# Patient Record
Sex: Male | Born: 1965 | Hispanic: Yes | Marital: Married | Smoking: Former smoker
Health system: Southern US, Community
[De-identification: ages and names within clinical notes are randomized; demographics above are authoritative.]

## PROBLEM LIST (undated history)

## (undated) DIAGNOSIS — Q760 Spina bifida occulta: Secondary | ICD-10-CM

## (undated) DIAGNOSIS — I10 Essential (primary) hypertension: Secondary | ICD-10-CM

## (undated) DIAGNOSIS — G4730 Sleep apnea, unspecified: Secondary | ICD-10-CM

## (undated) DIAGNOSIS — F329 Major depressive disorder, single episode, unspecified: Secondary | ICD-10-CM

## (undated) DIAGNOSIS — F32A Depression, unspecified: Secondary | ICD-10-CM

## (undated) HISTORY — PX: HEMORRHOID SURGERY: SHX153

## (undated) HISTORY — PX: OTHER SURGICAL HISTORY: SHX169

## (undated) HISTORY — DX: Depression, unspecified: F32.A

## (undated) HISTORY — DX: Major depressive disorder, single episode, unspecified: F32.9

## (undated) HISTORY — DX: Essential (primary) hypertension: I10

## (undated) HISTORY — DX: Sleep apnea, unspecified: G47.30

## (undated) HISTORY — PX: NO PAST SURGERIES: SHX2092

---

## 2010-10-19 ENCOUNTER — Ambulatory Visit: Payer: Self-pay | Admitting: Internal Medicine

## 2013-10-21 DIAGNOSIS — E876 Hypokalemia: Secondary | ICD-10-CM | POA: Insufficient documentation

## 2013-10-21 DIAGNOSIS — K648 Other hemorrhoids: Secondary | ICD-10-CM | POA: Insufficient documentation

## 2013-10-21 DIAGNOSIS — N401 Enlarged prostate with lower urinary tract symptoms: Secondary | ICD-10-CM

## 2013-10-21 DIAGNOSIS — N138 Other obstructive and reflux uropathy: Secondary | ICD-10-CM | POA: Insufficient documentation

## 2013-10-21 DIAGNOSIS — E785 Hyperlipidemia, unspecified: Secondary | ICD-10-CM | POA: Insufficient documentation

## 2013-12-01 ENCOUNTER — Ambulatory Visit: Payer: Self-pay | Admitting: Internal Medicine

## 2014-01-07 ENCOUNTER — Emergency Department: Payer: Self-pay | Admitting: Internal Medicine

## 2014-01-07 LAB — URINALYSIS, COMPLETE
BILIRUBIN, UR: NEGATIVE
BLOOD: NEGATIVE
Bacteria: NONE SEEN
Glucose,UR: NEGATIVE mg/dL (ref 0–75)
Ketone: NEGATIVE
Leukocyte Esterase: NEGATIVE
Nitrite: NEGATIVE
Ph: 6 (ref 4.5–8.0)
Protein: 30
RBC,UR: 1 /HPF (ref 0–5)
SPECIFIC GRAVITY: 1.02 (ref 1.003–1.030)
SQUAMOUS EPITHELIAL: NONE SEEN

## 2014-01-07 LAB — CBC
HCT: 46.3 % (ref 40.0–52.0)
HGB: 15.5 g/dL (ref 13.0–18.0)
MCH: 30.6 pg (ref 26.0–34.0)
MCHC: 33.4 g/dL (ref 32.0–36.0)
MCV: 92 fL (ref 80–100)
Platelet: 264 10*3/uL (ref 150–440)
RBC: 5.05 10*6/uL (ref 4.40–5.90)
RDW: 12.9 % (ref 11.5–14.5)
WBC: 6.7 10*3/uL (ref 3.8–10.6)

## 2014-01-07 LAB — COMPREHENSIVE METABOLIC PANEL
ALBUMIN: 4.1 g/dL (ref 3.4–5.0)
AST: 37 U/L (ref 15–37)
Alkaline Phosphatase: 86 U/L
Anion Gap: 7 (ref 7–16)
BUN: 12 mg/dL (ref 7–18)
Bilirubin,Total: 0.7 mg/dL (ref 0.2–1.0)
CHLORIDE: 103 mmol/L (ref 98–107)
CO2: 30 mmol/L (ref 21–32)
CREATININE: 0.79 mg/dL (ref 0.60–1.30)
Calcium, Total: 8.8 mg/dL (ref 8.5–10.1)
EGFR (African American): >60
Glucose: 100 mg/dL — ABNORMAL HIGH (ref 65–99)
OSMOLALITY: 279 (ref 275–301)
Potassium: 3.3 mmol/L — ABNORMAL LOW (ref 3.5–5.1)
SGPT (ALT): 46 U/L
Sodium: 140 mmol/L (ref 136–145)
TOTAL PROTEIN: 8.5 g/dL — AB (ref 6.4–8.2)

## 2014-01-07 LAB — TROPONIN I

## 2014-02-03 DIAGNOSIS — D3500 Benign neoplasm of unspecified adrenal gland: Secondary | ICD-10-CM | POA: Insufficient documentation

## 2014-02-03 DIAGNOSIS — I152 Hypertension secondary to endocrine disorders: Secondary | ICD-10-CM | POA: Insufficient documentation

## 2014-12-24 DIAGNOSIS — N401 Enlarged prostate with lower urinary tract symptoms: Secondary | ICD-10-CM | POA: Insufficient documentation

## 2014-12-24 DIAGNOSIS — I10 Essential (primary) hypertension: Secondary | ICD-10-CM | POA: Insufficient documentation

## 2014-12-24 DIAGNOSIS — E7800 Pure hypercholesterolemia, unspecified: Secondary | ICD-10-CM | POA: Insufficient documentation

## 2014-12-24 DIAGNOSIS — R351 Nocturia: Secondary | ICD-10-CM

## 2014-12-24 DIAGNOSIS — G629 Polyneuropathy, unspecified: Secondary | ICD-10-CM | POA: Insufficient documentation

## 2015-01-25 DIAGNOSIS — R252 Cramp and spasm: Secondary | ICD-10-CM | POA: Insufficient documentation

## 2015-01-30 ENCOUNTER — Other Ambulatory Visit: Payer: Self-pay | Admitting: Internal Medicine

## 2015-02-01 ENCOUNTER — Other Ambulatory Visit: Payer: Self-pay | Admitting: Internal Medicine

## 2015-02-01 DIAGNOSIS — D3501 Benign neoplasm of right adrenal gland: Secondary | ICD-10-CM

## 2015-02-09 ENCOUNTER — Ambulatory Visit: Admission: RE | Admit: 2015-02-09 | Payer: Self-pay | Source: Ambulatory Visit

## 2015-05-31 DIAGNOSIS — R351 Nocturia: Secondary | ICD-10-CM

## 2015-05-31 DIAGNOSIS — R748 Abnormal levels of other serum enzymes: Secondary | ICD-10-CM | POA: Insufficient documentation

## 2015-05-31 DIAGNOSIS — N401 Enlarged prostate with lower urinary tract symptoms: Secondary | ICD-10-CM | POA: Insufficient documentation

## 2015-11-01 ENCOUNTER — Other Ambulatory Visit: Payer: Self-pay | Admitting: Internal Medicine

## 2015-11-01 DIAGNOSIS — D3501 Benign neoplasm of right adrenal gland: Secondary | ICD-10-CM

## 2015-11-15 ENCOUNTER — Encounter: Payer: Self-pay | Admitting: Urology

## 2015-11-15 ENCOUNTER — Ambulatory Visit (INDEPENDENT_AMBULATORY_CARE_PROVIDER_SITE_OTHER): Payer: PRIVATE HEALTH INSURANCE | Admitting: Urology

## 2015-11-15 VITALS — BP 141/79 | HR 89 | Ht 65.0 in | Wt 204.6 lb

## 2015-11-15 DIAGNOSIS — R350 Frequency of micturition: Secondary | ICD-10-CM

## 2015-11-15 DIAGNOSIS — R300 Dysuria: Secondary | ICD-10-CM | POA: Diagnosis not present

## 2015-11-15 DIAGNOSIS — R351 Nocturia: Secondary | ICD-10-CM | POA: Diagnosis not present

## 2015-11-15 DIAGNOSIS — R39198 Other difficulties with micturition: Secondary | ICD-10-CM | POA: Diagnosis not present

## 2015-11-15 LAB — URINALYSIS, COMPLETE
Bilirubin, UA: NEGATIVE
GLUCOSE, UA: NEGATIVE
KETONES UA: NEGATIVE
Leukocytes, UA: NEGATIVE
Nitrite, UA: NEGATIVE
PROTEIN UA: NEGATIVE
RBC, UA: NEGATIVE
SPEC GRAV UA: 1.02 (ref 1.005–1.030)
Urobilinogen, Ur: 0.2 mg/dL (ref 0.2–1.0)
pH, UA: 6.5 (ref 5.0–7.5)

## 2015-11-15 LAB — MICROSCOPIC EXAMINATION
Bacteria, UA: NONE SEEN
Epithelial Cells (non renal): NONE SEEN /hpf (ref 0–10)

## 2015-11-15 LAB — BLADDER SCAN AMB NON-IMAGING: Scan Result: 24

## 2015-11-15 NOTE — Progress Notes (Signed)
11/15/2015 4:17 PM   Derek Koch Dec 23, 1965 [id number]  Referring provider: Dr. Candiss Norse  Chief Complaint  Patient presents with  . Prostatitis    New Patient    HPI: Patient is a 50 year old Hispanic male who presents today with interpreter, Derek Koch, who is a referral from Dr. Candiss Norse for prostatitis.    Patient complains of frequency of urination, urgency, dysuria, nocturia, incontinence, intermittency, hesitancy, straining to urinate and a weak urinary stream.  He states he has been experiencing these symptoms for over 10 years.  He states he feels a burning inside his penis and up into the left groin.  He experiences nocturia 5-6 times nightly.  He has been on tamsulosin 0.4 mg daily since July with no change in his urinating symptoms.  He was given a prescription for Levaquin on 10/30/2015 for prostatitis, but he states he has not noted any change in his urination.  He has not had any gross hematuria.  His UA today was unremarkable. His PVR was 24 mL.  His I PSS score is 30/6.       IPSS    Row Name 11/15/15 1600         International Prostate Symptom Score   How often have you had the sensation of not emptying your bladder? Almost always     How often have you had to urinate less than every two hours? Almost always     How often have you found you stopped and started again several times when you urinated? Less than 1 in 5 times     How often have you found it difficult to postpone urination? Almost always     How often have you had a weak urinary stream? More than half the time     How often have you had to strain to start urination? Almost always     How many times did you typically get up at night to urinate? 5 Times     Total IPSS Score 30       Quality of Life due to urinary symptoms   If you were to spend the rest of your life with your urinary condition just the way it is now how would you feel about that? Terrible        Score:  1-7 Mild 8-19  Moderate 20-35 Severe  He states when he urinates the urinary stream twists to the left side of the penis. This has been going on for the last 2 years.   PMH: Past Medical History:  Diagnosis Date  . Depression   . Hypertension   . Sleep apnea     Surgical History: Past Surgical History:  Procedure Laterality Date  . NO PAST SURGERIES      Home Medications:    Medication List       Accurate as of 11/15/15  4:17 PM. Always use your most recent med list.          hydrALAZINE 25 MG tablet Commonly known as:  APRESOLINE Take 25 mg by mouth 3 (three) times daily.   levofloxacin 500 MG tablet Commonly known as:  LEVAQUIN Take by mouth.   spironolactone 50 MG tablet Commonly known as:  ALDACTONE Take 50 mg by mouth daily.   tamsulosin 0.4 MG Caps capsule Commonly known as:  FLOMAX Take by mouth.       Allergies: No Known Allergies  Family History: Family History  Problem Relation Age of Onset  . Prostate cancer  Paternal Uncle   . Bladder Cancer Neg Hx   . Kidney cancer Neg Hx     Social History:  reports that he has quit smoking. He has never used smokeless tobacco. He reports that he drinks alcohol. He reports that he does not use drugs.  ROS: UROLOGY Frequent Urination?: Yes Hard to postpone urination?: Yes Burning/pain with urination?: Yes Get up at night to urinate?: Yes Leakage of urine?: Yes Urine stream starts and stops?: Yes Trouble starting stream?: Yes Do you have to strain to urinate?: Yes Blood in urine?: No Urinary tract infection?: No Sexually transmitted disease?: No Injury to kidneys or bladder?: No Painful intercourse?: No Weak stream?: Yes Erection problems?: Yes Penile pain?: No  Gastrointestinal Nausea?: No Vomiting?: No Indigestion/heartburn?: No Diarrhea?: No Constipation?: Yes  Constitutional Fever: Yes Night sweats?: Yes Weight loss?: No Fatigue?: No  Skin Skin rash/lesions?: Yes Itching?:  Yes  Eyes Blurred vision?: Yes Double vision?: No  Ears/Nose/Throat Sore throat?: No Sinus problems?: No  Hematologic/Lymphatic Swollen glands?: Yes Easy bruising?: Yes  Cardiovascular Leg swelling?: Yes Chest pain?: No  Respiratory Cough?: No Shortness of breath?: No  Endocrine Excessive thirst?: Yes  Musculoskeletal Back pain?: No Joint pain?: Yes  Neurological Headaches?: No Dizziness?: No  Psychologic Depression?: Yes Anxiety?: No  Physical Exam: BP (!) 141/79 (BP Location: Left Arm, Patient Position: Sitting, Cuff Size: Large)   Pulse 89   Ht 5\' 5"  (1.651 m)   Wt 204 lb 9.6 oz (92.8 kg)   BMI 34.05 kg/m   Constitutional: Well nourished. Alert and oriented, No acute distress. HEENT: [place] AT, moist mucus membranes. Trachea midline, no masses. Cardiovascular: No clubbing, cyanosis, or edema. Respiratory: Normal respiratory effort, no increased work of breathing. GI: Abdomen is soft, non tender, non distended, no abdominal masses. Liver and spleen not palpable.  No hernias appreciated.  Stool sample for occult testing is not indicated.   GU: No CVA tenderness.  No bladder fullness or masses.  Patient with uncircumcised phallus. Foreskin easily retracted  Urethral meatus is patent.  No penile discharge. No penile lesions or rashes. Scrotum without lesions, cysts, rashes and/or edema.  Testicles are located scrotally bilaterally. No masses are appreciated in the testicles. Left and right epididymis are normal. Rectal: Patient with  normal sphincter tone. Anus and perineum without scarring or rashes. No rectal masses are appreciated. Prostate is approximately 55 grams, no nodules are appreciated. Seminal vesicles are normal. Skin: No rashes, bruises or suspicious lesions. Lymph: No cervical or inguinal adenopathy. Neurologic: Grossly intact, no focal deficits, moving all 4 extremities. Psychiatric: Normal mood and affect.  Laboratory Data: Lab Results   Component Value Date   WBC 6.7 01/07/2014   HGB 15.5 01/07/2014   HCT 46.3 01/07/2014   MCV 92 01/07/2014   PLT 264 01/07/2014    Lab Results  Component Value Date   CREATININE 0.79 01/07/2014   PSA was 0.50 on 11/23/2014   Lab Results  Component Value Date   AST 37 01/07/2014   Lab Results  Component Value Date   ALT 46 01/07/2014    Urinalysis Unremarkable.  See EPIC.    Pertinent Imaging: Results for FORREST, MILLICAN (MRN [id number]) as of 11/20/2015 22:46  Ref. Range 11/15/2015 15:53  Scan Result Unknown 24    Assessment & Plan:    1. Urine stream spraying  - Patient complains of deviation of urine stream to the left for the last 2 years  - Schedule cystoscopy to evaluate for possible stricture  or bladder outlet obstruction  2. Frequency  - Urinalysis, Complete  - Bladder Scan (Post Void Residual) in office  - Patient undergoing a noncontrast CT for evaluation of a right adrenal adenoma in the future  - Continue tamsulosin 0.4 mg daily  - encourage to decrease alcohol consumption as this is a bladder irritant  - See above  3. Nocturia  - I explained to the patient that nocturia is often multi-factorial and difficult to treat.  Sleeping disorders, heart conditions and peripheral vascular disease, diabetes,  enlarged prostate or urethral stricture causing bladder outlet obstruction and/or certain medications.  - I have suggested that the patient avoid caffeine and alcohol in the evening. He may also benefit from fluid restrictions after 6:00 in the evening and voiding just prior to bedtime.  - I explained to the patient that research studies have showed that over 84% of patients with sleep apnea reported frequent nighttime urination.   With sleep apnea, oxygen decreases, carbon dioxide increases, the blood become more acidic, the heart rate drops and blood vessels in the lung constrict.  The body is then alerted that something is very wrong. The sleeper must wake  enough to reopen the airway. By this time, the heart is racing and experiences a false signal of fluid overload. The heart excretes a hormone-like protein that tells the body to get rid of sodium and water, resulting in nocturia.  - The patient may also benefit from a discussion with his primary care physician to see if he can obtain a sleep study.  4. Chronic dysuria  - UA is unremarkable  - cystoscopy pending to rule out CIS  Return for schedule cystoscopy sometime after the 4th.  These notes generated with voice recognition software. I apologize for typographical errors.  Zara Council, [facility] [address] [place], [place] [address] [phone]

## 2015-11-20 ENCOUNTER — Ambulatory Visit
Admission: RE | Admit: 2015-11-20 | Discharge: 2015-11-20 | Disposition: A | Discharge status: Home / Self Care | Payer: PRIVATE HEALTH INSURANCE | Source: Ambulatory Visit | Attending: Internal Medicine | Admitting: Internal Medicine

## 2015-11-20 ENCOUNTER — Other Ambulatory Visit: Payer: Self-pay | Admitting: Internal Medicine

## 2015-11-20 DIAGNOSIS — D3501 Benign neoplasm of right adrenal gland: Secondary | ICD-10-CM | POA: Diagnosis present

## 2015-11-21 ENCOUNTER — Other Ambulatory Visit: Payer: Self-pay | Admitting: Internal Medicine

## 2015-11-21 DIAGNOSIS — D3501 Benign neoplasm of right adrenal gland: Secondary | ICD-10-CM

## 2015-12-05 ENCOUNTER — Ambulatory Visit (INDEPENDENT_AMBULATORY_CARE_PROVIDER_SITE_OTHER): Payer: PRIVATE HEALTH INSURANCE | Admitting: Urology

## 2015-12-05 ENCOUNTER — Encounter: Payer: Self-pay | Admitting: Urology

## 2015-12-05 VITALS — BP 123/69 | HR 80 | Wt 200.7 lb

## 2015-12-05 DIAGNOSIS — R3912 Poor urinary stream: Secondary | ICD-10-CM | POA: Diagnosis not present

## 2015-12-05 LAB — URINALYSIS, COMPLETE
Bilirubin, UA: NEGATIVE
Glucose, UA: NEGATIVE
KETONES UA: NEGATIVE
Leukocytes, UA: NEGATIVE
NITRITE UA: NEGATIVE
Protein, UA: NEGATIVE
RBC, UA: NEGATIVE
Specific Gravity, UA: 1.015 (ref 1.005–1.030)
Urobilinogen, Ur: 0.2 mg/dL (ref 0.2–1.0)
pH, UA: 8.5 — ABNORMAL HIGH (ref 5.0–7.5)

## 2015-12-05 LAB — MICROSCOPIC EXAMINATION
Bacteria, UA: NONE SEEN
RBC MICROSCOPIC, UA: NONE SEEN /HPF (ref 0–?)

## 2015-12-05 MED ORDER — LIDOCAINE HCL 2 % EX GEL
1.0000 "application " | Freq: Once | CUTANEOUS | Status: AC
  Administered 2015-12-05: 1 via URETHRAL

## 2015-12-05 MED ORDER — CIPROFLOXACIN HCL 500 MG PO TABS
500.0000 mg | ORAL_TABLET | Freq: Once | ORAL | Status: AC
  Administered 2015-12-05: 500 mg via ORAL

## 2015-12-05 MED ORDER — FINASTERIDE 5 MG PO TABS: 5.0000 mg | ORAL_TABLET | Freq: Every day | ORAL | 3 refills | Status: AC

## 2015-12-05 NOTE — Progress Notes (Signed)
12/05/2015 4:07 PM   Derek Koch 1965-03-05 [id number]  Referring provider: Glendon Axe, MD [place] [place], [place] [address]  Chief Complaint  Patient presents with  . Cysto    urinary stream spraying     HPI: Derek Koch is a 50yo with severe LUTS here for cystoscopy for refractory urgency, frequency and spraying of his stream. He is currently taking flomax BID. No hematuria    PMH: Past Medical History:  Diagnosis Date  . Depression   . Hypertension   . Sleep apnea     Surgical History: Past Surgical History:  Procedure Laterality Date  . NO PAST SURGERIES      Home Medications:    Medication List       Accurate as of 12/05/15  4:07 PM. Always use your most recent med list.          hydrALAZINE 25 MG tablet Commonly known as:  APRESOLINE Take 25 mg by mouth 3 (three) times daily.   spironolactone 50 MG tablet Commonly known as:  ALDACTONE Take 50 mg by mouth daily.   tamsulosin 0.4 MG Caps capsule Commonly known as:  FLOMAX Take by mouth.       Allergies: No Known Allergies  Family History: Family History  Problem Relation Age of Onset  . Prostate cancer Paternal Uncle   . Bladder Cancer Neg Hx   . Kidney cancer Neg Hx     Social History:  reports that he has quit smoking. He has never used smokeless tobacco. He reports that he drinks alcohol. He reports that he does not use drugs.  ROS:                                        Physical Exam: BP 123/69   Pulse 80   Wt 91 kg (200 lb 11.2 oz)   BMI 33.40 kg/m   Constitutional:  Alert and oriented, No acute distress. HEENT: [place] AT, moist mucus membranes.  Trachea midline, no masses. Cardiovascular: No clubbing, cyanosis, or edema. Respiratory: Normal respiratory effort, no increased work of breathing. GI: Abdomen is soft, nontender, nondistended, no abdominal masses GU: No CVA tenderness.  Skin: No rashes, bruises or  suspicious lesions. Lymph: No cervical or inguinal adenopathy. Neurologic: Grossly intact, no focal deficits, moving all 4 extremities. Psychiatric: Normal mood and affect.  Laboratory Data: Lab Results  Component Value Date   WBC 6.7 01/07/2014   HGB 15.5 01/07/2014   HCT 46.3 01/07/2014   MCV 92 01/07/2014   PLT 264 01/07/2014    Lab Results  Component Value Date   CREATININE 0.79 01/07/2014    No results found for: PSA  No results found for: TESTOSTERONE  No results found for: HGBA1C  Urinalysis    Component Value Date/Time   COLORURINE Yellow 01/07/2014 0958   APPEARANCEUR Clear 11/15/2015 1543   LABSPEC 1.020 01/07/2014 0958   PHURINE 6.0 01/07/2014 0958   GLUCOSEU Negative 11/15/2015 1543   GLUCOSEU Negative 01/07/2014 0958   HGBUR Negative 01/07/2014 0958   BILIRUBINUR Negative 11/15/2015 1543   BILIRUBINUR Negative 01/07/2014 0958   KETONESUR Negative 01/07/2014 0958   PROTEINUR Negative 11/15/2015 1543   PROTEINUR 30 mg/dL 01/07/2014 0958   NITRITE Negative 11/15/2015 1543   NITRITE Negative 01/07/2014 0958   LEUKOCYTESUR Negative 11/15/2015 1543   LEUKOCYTESUR Negative 01/07/2014 0958    Pertinent Imaging:  None    12/05/15  CC:  Chief Complaint  Patient presents with  . Cysto    urinary stream spraying     HPI:  Blood pressure 123/69, pulse 80, weight 91 kg (200 lb 11.2 oz). NED. A&Ox3.   No respiratory distress   Abd soft, NT, ND Normal phallus with bilateral descended testicles  Cystoscopy Procedure Note  Patient identification was confirmed, informed consent was obtained, and patient was prepped using Betadine solution.  Lidocaine jelly was administered per urethral meatus.    Preoperative abx where received prior to procedure.     Pre-Procedure: - Inspection reveals a normal caliber ureteral meatus.  Procedure: The flexible cystoscope was introduced without difficulty - No urethral strictures/lesions are present. -  Enlarged prostate bilobar hypertrophy - Elevated bladder neck - Bilateral ureteral orifices identified - Bladder mucosa  reveals no ulcers, tumors, or lesions - No bladder stones - No trabeculation  Retroflexion shows 2cm intravesical prostatic protrusion   Post-Procedure: - Patient tolerated the procedure well  Assessment/ Plan:   Assessment & Plan:    1. Weak urinary stream -finasteride 5mg  daily -BID flomx -RTC 3 months - Urinalysis, Complete - ciprofloxacin (CIPRO) tablet 500 mg; Take 1 tablet (500 mg total) by mouth once. - lidocaine (XYLOCAINE) 2 % jelly 1 application; Place 1 application into the urethra once.   No Follow-up on file.  Nicolette Bang, MD  [facility] [address], [place], [place] [address] [phone]

## 2016-03-04 ENCOUNTER — Ambulatory Visit (INDEPENDENT_AMBULATORY_CARE_PROVIDER_SITE_OTHER): Payer: BLUE CROSS/BLUE SHIELD | Admitting: Urology

## 2016-03-04 ENCOUNTER — Encounter: Payer: Self-pay | Admitting: Urology

## 2016-03-04 VITALS — BP 129/74 | HR 83 | Ht 65.0 in | Wt 202.0 lb

## 2016-03-04 DIAGNOSIS — R300 Dysuria: Secondary | ICD-10-CM | POA: Diagnosis not present

## 2016-03-04 LAB — URINALYSIS, COMPLETE
Bilirubin, UA: NEGATIVE
GLUCOSE, UA: NEGATIVE
Ketones, UA: NEGATIVE
Leukocytes, UA: NEGATIVE
NITRITE UA: NEGATIVE
PH UA: 7.5 (ref 5.0–7.5)
PROTEIN UA: NEGATIVE
RBC, UA: NEGATIVE
Specific Gravity, UA: 1.02 (ref 1.005–1.030)
Urobilinogen, Ur: 0.2 mg/dL (ref 0.2–1.0)

## 2016-03-04 LAB — MICROSCOPIC EXAMINATION
BACTERIA UA: NONE SEEN
Epithelial Cells (non renal): NONE SEEN /hpf (ref 0–10)
RBC MICROSCOPIC, UA: NONE SEEN /HPF (ref 0–?)

## 2016-03-04 NOTE — Progress Notes (Signed)
03/04/2016 3:21 PM   Derek Koch 06/10/1965 [id number]  Referring provider: Glendon Axe, MD [facility] [place], [place] [address]  Chief Complaint  Patient presents with  . Follow-up    58month    HPI: The patient was recently assessed for refractory urgency and frequency and spraying of his stream. He had cystoscopy. He was given Flomax 0.8 mg.  He had a cystoscopy October 17 and had bilobar enlargement of the prostate and an elevated bladder neck and some intravesical protrusion of the prostate into the bladder  When I reviewed Derek Koch's notes it appears that he has had lower urinary rack symptoms for approximately 10 years and has been treated for prostatitis. He gets burning in the penis and left groin. He also has chronic nocturia. I did not see any positive urine cultures  Today Frequency and especially nighttime frequency is much better. He is having much less burning. He reports taking a pill once a day that's helping   PMH: Past Medical History:  Diagnosis Date  . Depression   . Hypertension   . Sleep apnea     Surgical History: Past Surgical History:  Procedure Laterality Date  . NO PAST SURGERIES      Home Medications:  Allergies as of 03/04/2016   No Known Allergies     Medication List       Accurate as of 03/04/16  3:21 PM. Always use your most recent med list.          finasteride 5 MG tablet Commonly known as:  PROSCAR Take 1 tablet (5 mg total) by mouth daily.   hydrALAZINE 25 MG tablet Commonly known as:  APRESOLINE Take 25 mg by mouth 3 (three) times daily.   spironolactone 50 MG tablet Commonly known as:  ALDACTONE Take 50 mg by mouth daily.   tamsulosin 0.4 MG Caps capsule Commonly known as:  FLOMAX Take by mouth.       Allergies: No Known Allergies  Family History: Family History  Problem Relation Age of Onset  . Prostate cancer Paternal Uncle   . Bladder Cancer Neg Hx   . Kidney  cancer Neg Hx     Social History:  reports that he has quit smoking. He has never used smokeless tobacco. He reports that he drinks alcohol. He reports that he does not use drugs.  ROS: UROLOGY Frequent Urination?: No Hard to postpone urination?: No Burning/pain with urination?: Yes Get up at night to urinate?: Yes Leakage of urine?: Yes Urine stream starts and stops?: No Trouble starting stream?: No Do you have to strain to urinate?: No Blood in urine?: No Urinary tract infection?: No Sexually transmitted disease?: No Injury to kidneys or bladder?: No Painful intercourse?: No Weak stream?: No Erection problems?: No Penile pain?: No  Gastrointestinal Nausea?: No Vomiting?: No Indigestion/heartburn?: Yes Diarrhea?: No Constipation?: No  Constitutional Fever: No Night sweats?: No Weight loss?: No Fatigue?: No  Skin Skin rash/lesions?: No Itching?: No  Eyes Blurred vision?: No Double vision?: No  Ears/Nose/Throat Sore throat?: No Sinus problems?: No  Hematologic/Lymphatic Swollen glands?: No Easy bruising?: No  Cardiovascular Leg swelling?: No Chest pain?: No  Respiratory Cough?: No Shortness of breath?: No  Endocrine Excessive thirst?: No  Musculoskeletal Back pain?: No Joint pain?: No  Neurological Headaches?: No Dizziness?: No  Psychologic Depression?: No Anxiety?: No  Physical Exam: BP 129/74   Pulse 83   Ht 5\' 5"  (1.651 m)   Wt 202 lb (91.6 kg)  BMI 33.61 kg/m     Laboratory Data: Lab Results  Component Value Date   WBC 6.7 01/07/2014   HGB 15.5 01/07/2014   HCT 46.3 01/07/2014   MCV 92 01/07/2014   PLT 264 01/07/2014    Lab Results  Component Value Date   CREATININE 0.79 01/07/2014    No results found for: PSA  No results found for: TESTOSTERONE  No results found for: HGBA1C  Urinalysis    Component Value Date/Time   COLORURINE Yellow 01/07/2014 0958   APPEARANCEUR Clear 12/05/2015 1548   LABSPEC 1.020  01/07/2014 0958   PHURINE 6.0 01/07/2014 0958   GLUCOSEU Negative 12/05/2015 1548   GLUCOSEU Negative 01/07/2014 0958   HGBUR Negative 01/07/2014 0958   BILIRUBINUR Negative 12/05/2015 1548   BILIRUBINUR Negative 01/07/2014 0958   KETONESUR Negative 01/07/2014 0958   PROTEINUR Negative 12/05/2015 1548   PROTEINUR 30 mg/dL 01/07/2014 0958   NITRITE Negative 12/05/2015 1548   NITRITE Negative 01/07/2014 0958   LEUKOCYTESUR Negative 12/05/2015 1548   LEUKOCYTESUR Negative 01/07/2014 0958    Pertinent Imaging: none  Assessment & Plan:  The patient is clinically much better. It turns out that he is on Flomax twice a day and finasteride once a day Overall he is is improved. We'll follow up in 6 months.   1. Dysuria 2. Nighttime frequency  - Urinalysis, Complete - CULTURE, URINE COMPREHENSIVE   Return in about 6 months (around 09/01/2016).  Reece Packer, MD  [facility] [address], [place], [place] [address] [phone]

## 2016-03-10 LAB — CULTURE, URINE COMPREHENSIVE

## 2016-03-11 ENCOUNTER — Telehealth: Payer: Self-pay

## 2016-03-11 DIAGNOSIS — N390 Urinary tract infection, site not specified: Secondary | ICD-10-CM

## 2016-03-11 MED ORDER — NITROFURANTOIN MACROCRYSTAL 100 MG PO CAPS: 100.0000 mg | ORAL_CAPSULE | Freq: Two times a day (BID) | ORAL | 7 refills | Status: AC

## 2016-03-11 NOTE — Telephone Encounter (Signed)
Bjorn Loser, MD  Lestine Box, LPN        Macrodantin 100 mg bid for 7 days    [facility]- abx sent to pharmacy.

## 2016-09-02 NOTE — Progress Notes (Signed)
09/03/2016 4:34 PM   Derek Koch 1965-12-18 [id number]  Referring provider: Dr. Candiss Norse  Chief Complaint  Patient presents with  . Dysuria    6 month follow up   . Nocturia    HPI: 51 yo Hispanic male who presents today for a yearly follow up with interpreter, Leola Brazil for BPH with LU TS, dysuria and nocturia.  BPH WITH LUTS  (prostate and/or bladder) His IPSS score today is 30, which is severe lower urinary tract symptomatology. He is terrible with his quality life due to his urinary symptoms. His PVR is 0 mL.  His previous IPSS score was 30/6.  His previous PVR is 24 mL.  His major complaints today are Frequency, urgency, dysuria, nocturia, incontinence, intermittency, hesitancy, straining to urinate, weak urinary stream, erection problems and penile pain.Marland Kitchen  He has had these symptoms for two years.  He denies any hematuria and suprapubic pain.   He currently taking tamsulosin 0.4 mg twice a day and finasteride 5 mg daily.  His has had a cystoscopy in 2017 which noted a 2 cm intervascular prostatic protrusion.   He also denies any recent fevers, chills, nausea or vomiting.  His paternal uncle had been diagnosed with prostate cancer.     IPSS    Row Name 09/03/16 1500         International Prostate Symptom Score   How often have you had the sensation of not emptying your bladder? Almost always     How often have you had to urinate less than every two hours? Almost always     How often have you found you stopped and started again several times when you urinated? Almost always     How often have you found it difficult to postpone urination? Not at All     How often have you had a weak urinary stream? Almost always     How often have you had to strain to start urination? Almost always     How many times did you typically get up at night to urinate? 5 Times     Total IPSS Score 30       Quality of Life due to urinary symptoms   If you were to spend the rest of your life with  your urinary condition just the way it is now how would you feel about that? Terrible        Score:  1-7 Mild 8-19 Moderate 20-35 Severe  Dysuria Patient has been evaluated for CIS with cystoscopy in 2017.  No malignancies seen.  His UA today was unremarkable.  Nocturia Patient has been diagnosed with sleep apnea and is attempting to sleep with a CPAP machine. Fortunately he is finding that he needs to remove the machine constantly during the night to urinate.  PMH: Past Medical History:  Diagnosis Date  . Depression   . Hypertension   . Sleep apnea     Surgical History: Past Surgical History:  Procedure Laterality Date  . NO PAST SURGERIES      Home Medications:  Allergies as of 09/03/2016   No Known Allergies     Medication List       Accurate as of 09/03/16  4:34 PM. Always use your most recent med list.          amLODipine 10 MG tablet Commonly known as:  NORVASC Take by mouth.   APPLE CIDER VINEGAR PO Take by mouth.   CENTRUM ADULTS PO Take by mouth.  Desmopressin Acetate 1.66 MCG/0.1ML Emul Commonly known as:  [place] 1 Act into the nose at bedtime.   finasteride 5 MG tablet Commonly known as:  PROSCAR Take 1 tablet (5 mg total) by mouth daily.   FISH OIL PO Take by mouth.   hydrALAZINE 25 MG tablet Commonly known as:  APRESOLINE Take 25 mg by mouth 3 (three) times daily.   nitrofurantoin 100 MG capsule Commonly known as:  MACRODANTIN Take 1 capsule (100 mg total) by mouth 2 (two) times daily.   spironolactone 50 MG tablet Commonly known as:  ALDACTONE Take 50 mg by mouth daily.   tamsulosin 0.4 MG Caps capsule Commonly known as:  FLOMAX Take 0.4 mg by mouth 2 (two) times daily.       Allergies: No Known Allergies  Family History: Family History  Problem Relation Age of Onset  . Prostate cancer Paternal Uncle   . Bladder Cancer Neg Hx   . Kidney cancer Neg Hx     Social History:  reports that he quit smoking about  21 years ago. He has never used smokeless tobacco. He reports that he drinks alcohol. He reports that he does not use drugs.  ROS: UROLOGY Frequent Urination?: Yes Hard to postpone urination?: Yes Burning/pain with urination?: Yes Get up at night to urinate?: Yes Leakage of urine?: Yes Urine stream starts and stops?: Yes Trouble starting stream?: Yes Do you have to strain to urinate?: Yes Blood in urine?: No Urinary tract infection?: No Sexually transmitted disease?: No Injury to kidneys or bladder?: No Painful intercourse?: No Weak stream?: Yes Erection problems?: Yes Penile pain?: Yes  Gastrointestinal Nausea?: No Vomiting?: No Indigestion/heartburn?: No Diarrhea?: No Constipation?: No  Constitutional Fever: No Night sweats?: Yes Weight loss?: No Fatigue?: No  Skin Skin rash/lesions?: Yes Itching?: Yes  Eyes Blurred vision?: No Double vision?: No  Ears/Nose/Throat Sore throat?: No Sinus problems?: No  Hematologic/Lymphatic Swollen glands?: No Easy bruising?: Yes  Cardiovascular Leg swelling?: Yes Chest pain?: No  Respiratory Cough?: No Shortness of breath?: No  Endocrine Excessive thirst?: Yes  Musculoskeletal Back pain?: No Joint pain?: No  Neurological Headaches?: No Dizziness?: No  Psychologic Depression?: No Anxiety?: No  Physical Exam: BP 114/66   Pulse 82   Ht 5\' 4"  (1.626 m)   Wt 204 lb 4.8 oz (92.7 kg)   BMI 35.07 kg/m   Constitutional: Well nourished. Alert and oriented, No acute distress. HEENT: [place] AT, moist mucus membranes. Trachea midline, no masses. Cardiovascular: No clubbing, cyanosis, or edema. Respiratory: Normal respiratory effort, no increased work of breathing. GI: Abdomen is soft, non tender, non distended, no abdominal masses. Liver and spleen not palpable.  No hernias appreciated.  Stool sample for occult testing is not indicated.   GU: No CVA tenderness.  No bladder fullness or masses.  Patient with  uncircumcised phallus. Foreskin easily retracted  Urethral meatus is patent.  No penile discharge. No penile lesions or rashes. Scrotum without lesions, cysts, rashes and/or edema.  Testicles are located scrotally bilaterally. No masses are appreciated in the testicles. Left and right epididymis are normal. Rectal: Patient with  normal sphincter tone. Anus and perineum without scarring or rashes. No rectal masses are appreciated. Prostate is approximately 55 grams, no nodules are appreciated. Seminal vesicles are normal. Skin: No rashes, bruises or suspicious lesions. Lymph: No cervical or inguinal adenopathy. Neurologic: Grossly intact, no focal deficits, moving all 4 extremities. Psychiatric: Normal mood and affect.  Laboratory Data: PSA was 0.50 on 11/23/2014  Urinalysis Unremarkable.  See EPIC.    I have reviewed the labs  Pertinent Imaging: Results for JOMARI, BARTNIK (MRN [id number]) as of 09/03/2016 21:28  Ref. Range 09/03/2016 16:13  Scan Result Unknown 0    Assessment & Plan:    1. Urine stream spraying  - resolved  2. Nocturia  - patient has sleep apnea and is attempting to use the CPAP machine, but he still is experiencing nocturia   - trial of Noctiva 1.66 one spray to one nostril nightly 30 minutes prior to bedtime  - RTC in one week for sodium level  3. Chronic dysuria  - UA is unremarkable  - no CIS seen on cystoscopy  4. BPH with LUTS  - IPSS score is 30/6, it is stable  - Continue conservative management, avoiding bladder irritants and timed voiding's  - most bothersome symptoms is/are nocturia  - Continue tamsulosin 0.4 mg daily and finasteride 5 mg daily :refills given  - PSA drawn today  - RTC pending PSA   Return in about 1 week (around 09/10/2016) for check sodium level only.  These notes generated with voice recognition software. I apologize for typographical errors.  Zara Council, [facility] [address] [place], [place] [address] [phone]

## 2016-09-03 ENCOUNTER — Ambulatory Visit (INDEPENDENT_AMBULATORY_CARE_PROVIDER_SITE_OTHER): Payer: BLUE CROSS/BLUE SHIELD | Admitting: Urology

## 2016-09-03 ENCOUNTER — Encounter: Payer: Self-pay | Admitting: Urology

## 2016-09-03 VITALS — BP 114/66 | HR 82 | Ht 64.0 in | Wt 204.3 lb

## 2016-09-03 DIAGNOSIS — R300 Dysuria: Secondary | ICD-10-CM | POA: Diagnosis not present

## 2016-09-03 DIAGNOSIS — N138 Other obstructive and reflux uropathy: Secondary | ICD-10-CM | POA: Diagnosis not present

## 2016-09-03 DIAGNOSIS — N401 Enlarged prostate with lower urinary tract symptoms: Secondary | ICD-10-CM

## 2016-09-03 DIAGNOSIS — R351 Nocturia: Secondary | ICD-10-CM | POA: Diagnosis not present

## 2016-09-03 LAB — BLADDER SCAN AMB NON-IMAGING: Scan Result: 0

## 2016-09-03 MED ORDER — DESMOPRESSIN ACETATE 1.66 MCG/0.1ML NA EMUL: 1.0000 | Freq: Every day | NASAL | 11 refills | Status: AC

## 2016-09-04 ENCOUNTER — Telehealth: Payer: Self-pay | Admitting: Family Medicine

## 2016-09-04 LAB — URINALYSIS, COMPLETE
Bilirubin, UA: NEGATIVE
GLUCOSE, UA: NEGATIVE
Ketones, UA: NEGATIVE
Leukocytes, UA: NEGATIVE
Nitrite, UA: NEGATIVE
Protein, UA: NEGATIVE
RBC, UA: NEGATIVE
Specific Gravity, UA: >1.03 — ABNORMAL HIGH (ref 1.005–1.030)
UUROB: 0.2 mg/dL (ref 0.2–1.0)
pH, UA: 5.5 (ref 5.0–7.5)

## 2016-09-04 LAB — PSA: PROSTATE SPECIFIC AG, SERUM: 0.3 ng/mL (ref 0.0–4.0)

## 2016-09-04 NOTE — Telephone Encounter (Signed)
-----   Message from Nori Riis, PA-C sent at 09/04/2016 12:40 PM EDT ----- Please let Mr. Turay know that his PSA was normal.

## 2016-09-10 ENCOUNTER — Other Ambulatory Visit: Payer: BLUE CROSS/BLUE SHIELD

## 2016-09-10 DIAGNOSIS — Z79899 Other long term (current) drug therapy: Secondary | ICD-10-CM

## 2016-09-11 ENCOUNTER — Telehealth: Payer: Self-pay

## 2016-09-11 DIAGNOSIS — Z79899 Other long term (current) drug therapy: Secondary | ICD-10-CM

## 2016-09-11 LAB — SODIUM: SODIUM: 136 mmol/L (ref 134–144)

## 2016-09-11 NOTE — Telephone Encounter (Signed)
-----   Message from Nori Riis, PA-C sent at 09/11/2016  7:53 AM EDT ----- Please let Derek Koch know that his sodium level is normal.  We need to recheck it in one month.  Is the Noctiva working for him?

## 2016-09-11 NOTE — Telephone Encounter (Signed)
Called patient. No answer. Left vmail per DPR.  Asked pt to return call to schedule lab visit in 1 month, and to give update on Noctiva. Placed lab order.

## 2017-12-31 IMAGING — CT CT ABDOMEN W/O CM
2 of 7 series · 14 of 46 positions shown, 18 images · non-contrast
Comparison: 12/01/2013

CLINICAL DATA: F/U adrenal adenoma, pt. Has no complaints

EXAM:
CT ABDOMEN WITHOUT CONTRAST
TECHNIQUE: Multidetector CT imaging of the abdomen was performed following the
standard protocol without IV contrast.

[Series 2: axial st · axial · 0.84mm/px · z∈[-298,-78]mm · 11 of 53 slices shown, 15 images]
[im 6/53  soft-tissue]
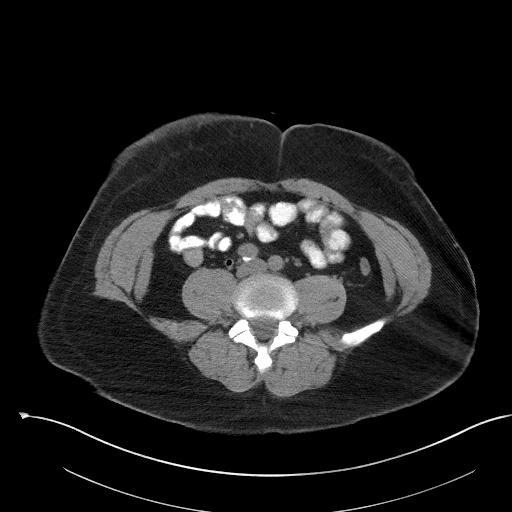
[im 6/53  bone]
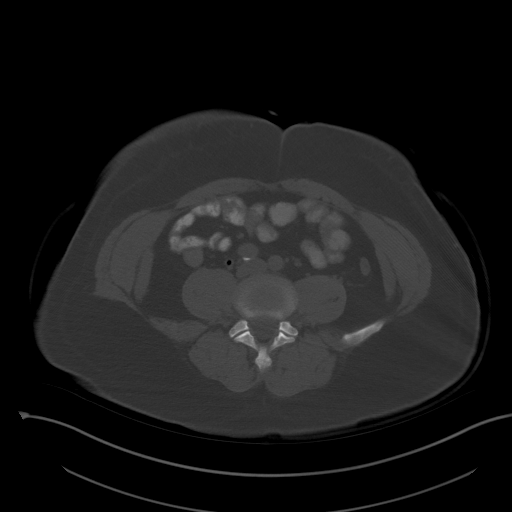
[im 11/53  soft-tissue]
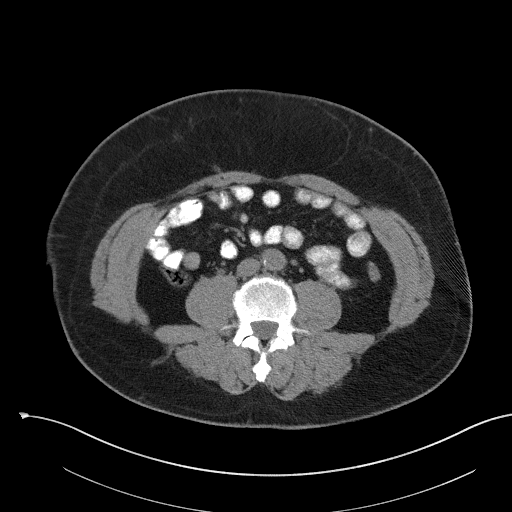
[im 17/53  soft-tissue]
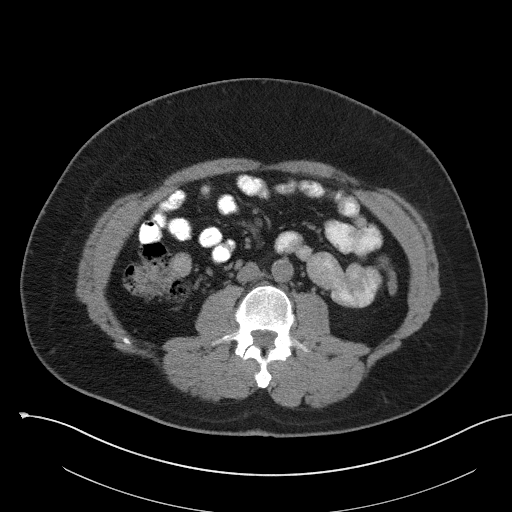
[im 22/53  soft-tissue]
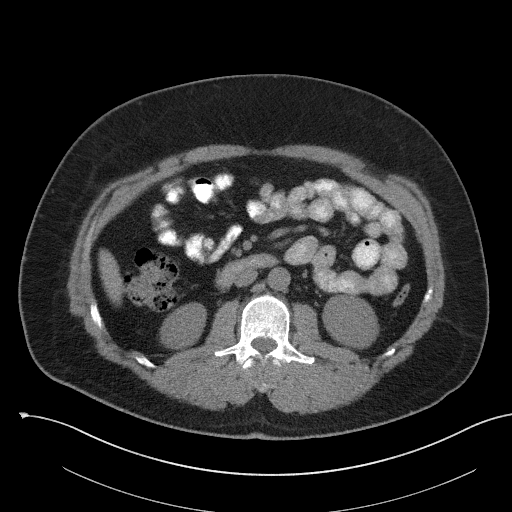
[im 28/53  soft-tissue]
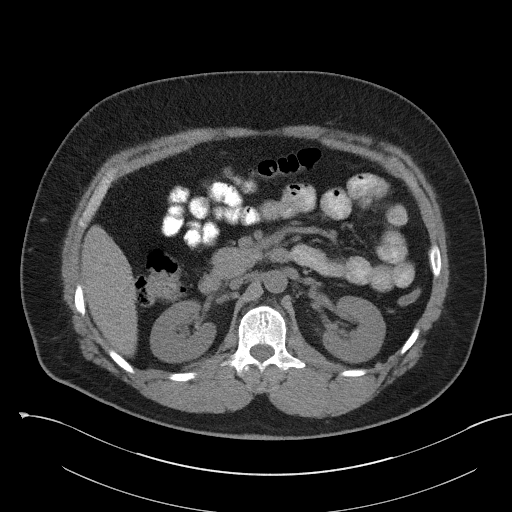
[im 33/53  soft-tissue]
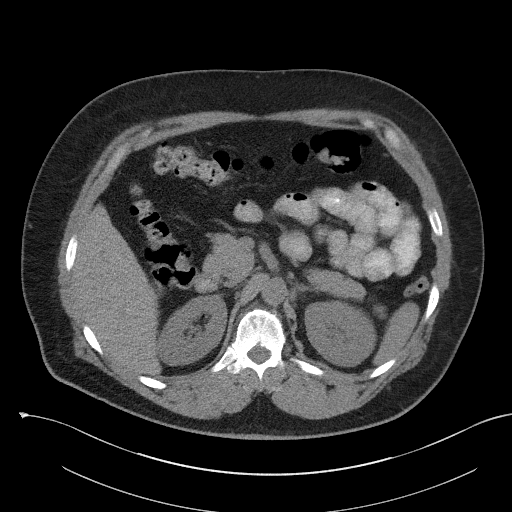
[im 39/53  soft-tissue]
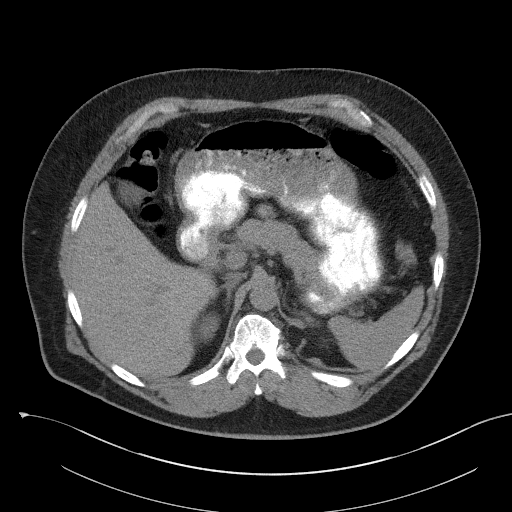
[im 42/53  lung]
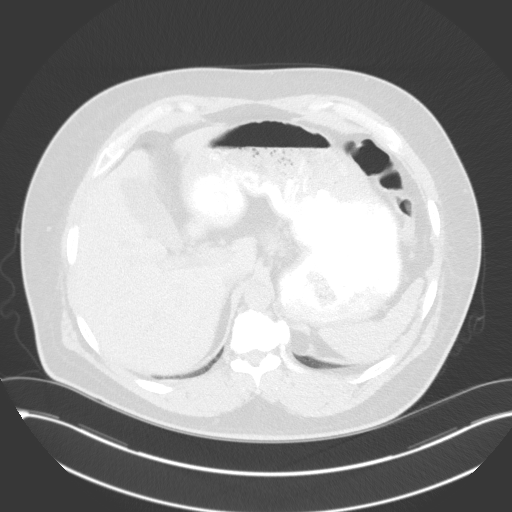
[im 44/53  soft-tissue]
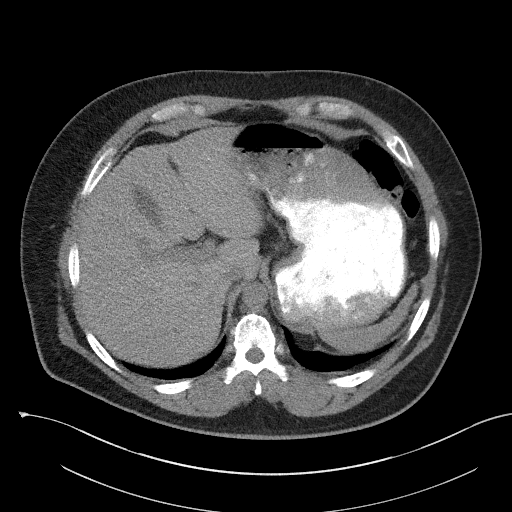
[im 44/53  lung]
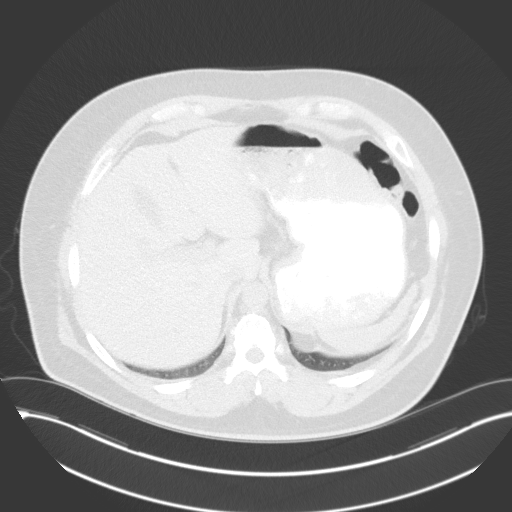
[im 47/53  lung]
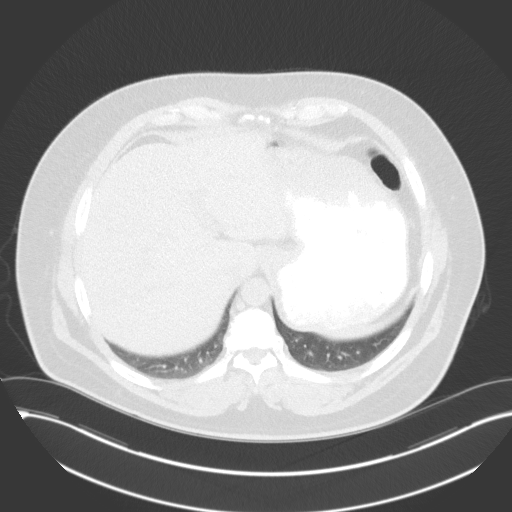
[im 50/53  soft-tissue]
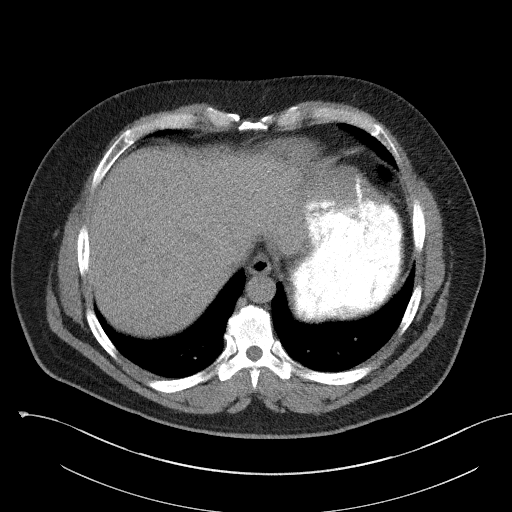
[im 50/53  lung]
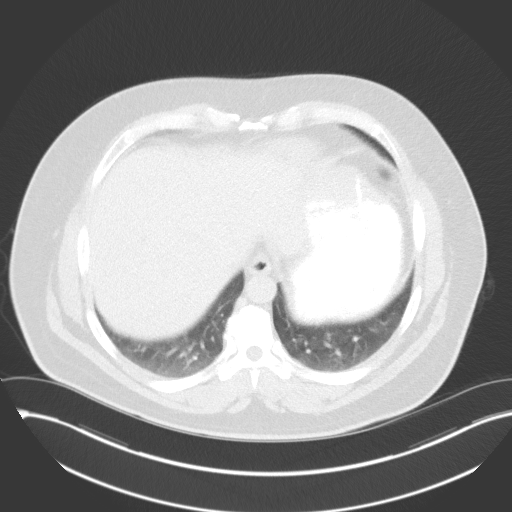
[im 50/53  bone]
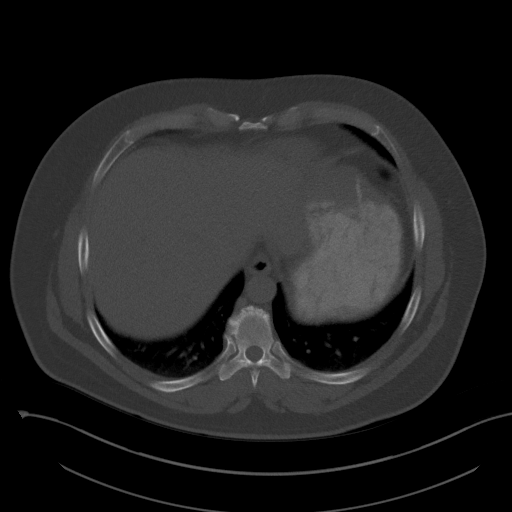

[Series 8: coronal st · coronal · 0.55mm/px · 3 of 96 slices shown]
[im 24/96  soft-tissue]
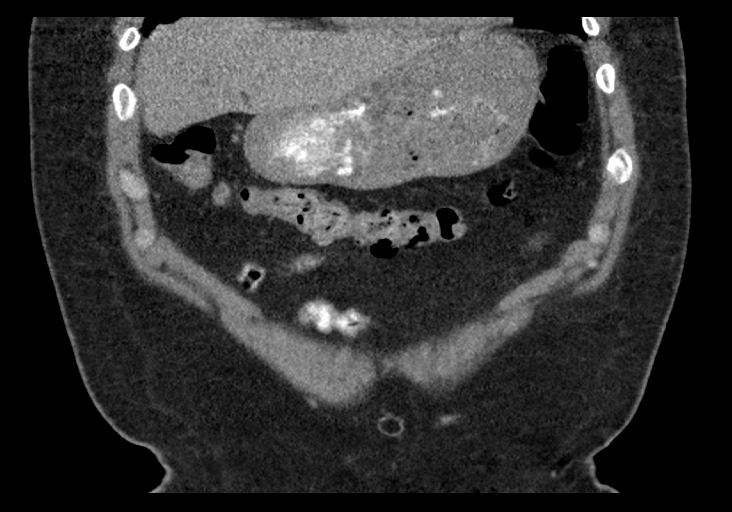
[im 48/96  soft-tissue]
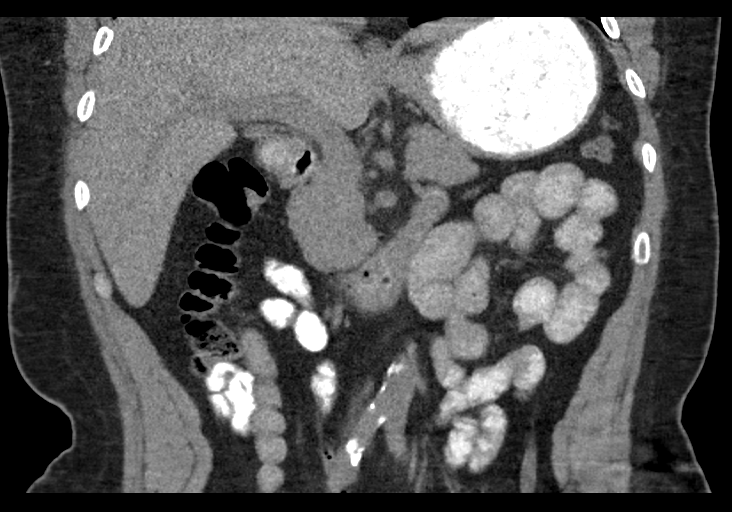
[im 72/96  soft-tissue]
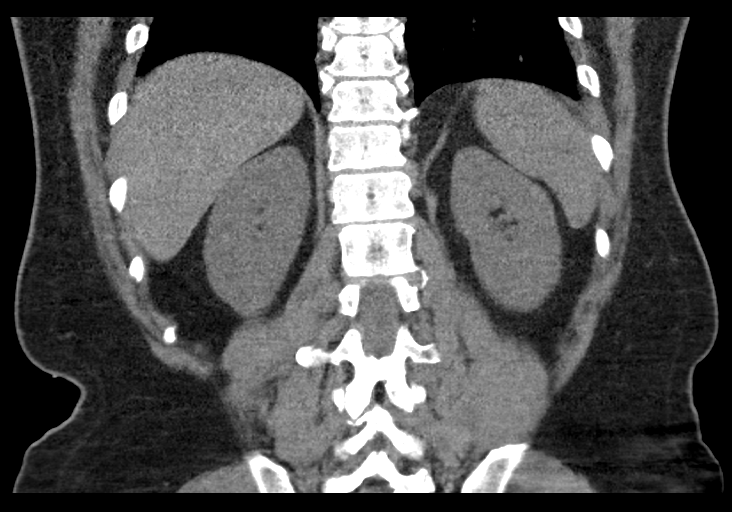

[14 of 46 positions shown; findings below may reference images not displayed]

FINDINGS: Lower chest: No acute abnormality.

Hepatobiliary: No focal liver abnormality is seen. No gallstones,
gallbladder wall thickening, or biliary dilatation.

Pancreas: Unremarkable. No pancreatic ductal dilatation or
surrounding inflammatory changes.

Spleen: Normal in size without focal abnormality.

Adrenals/Urinary Tract: Small right adrenal mass, currently measures
8 mm in greatest dimension, previously 10 x 8 mm. It is relatively
low in attenuation from average Hounsfield units of 20. No left
adrenal mass.

Kidneys and visualize ureters are unremarkable.

Stomach/Bowel: Stomach and visualized bowel is normal. A portion of
a normal appendix is visualized.

Vascular/Lymphatic: Mild aortic atherosclerotic calcifications. No
adenopathy.

Other: No abdominal wall hernia or abnormality.

Musculoskeletal: No acute or significant osseous findings.
IMPRESSION: 1. 8 mm, stable, right adrenal adenoma. This needs no further
evaluation.
2. No other significant findings.  No acute abnormalities.

## 2020-02-07 ENCOUNTER — Ambulatory Visit: Payer: BC Managed Care – PPO | Attending: Internal Medicine

## 2020-02-07 DIAGNOSIS — Z23 Encounter for immunization: Secondary | ICD-10-CM

## 2020-02-07 NOTE — Progress Notes (Signed)
   Covid-19 Vaccination Clinic  Name:  Derek Koch    MRN: [id number] DOB: 16-Jun-1965  02/07/2020  Derek Koch was observed post Covid-19 immunization for 15 minutes without incident. He was provided with Vaccine Information Sheet and instruction to access the V-Safe system.   Derek Koch was instructed to call 911 with any severe reactions post vaccine: Marland Kitchen Difficulty breathing  . Swelling of face and throat  . A fast heartbeat  . A bad rash all over body  . Dizziness and weakness   Immunizations Administered    Name Date Dose VIS Date Route   Pfizer COVID-19 Vaccine 02/07/2020  2:39 PM 0.3 mL 12/08/2019 Intramuscular   Manufacturer: [place]   Lot: [id number]   NDC: [address]-7

## 2020-02-14 ENCOUNTER — Other Ambulatory Visit: Payer: Self-pay

## 2020-02-14 ENCOUNTER — Other Ambulatory Visit
Admission: RE | Admit: 2020-02-14 | Discharge: 2020-02-14 | Disposition: A | Discharge status: Home / Self Care | Payer: BC Managed Care – PPO | Source: Ambulatory Visit | Attending: Internal Medicine | Admitting: Internal Medicine

## 2020-02-14 DIAGNOSIS — Z1211 Encounter for screening for malignant neoplasm of colon: Secondary | ICD-10-CM | POA: Diagnosis present

## 2020-02-14 DIAGNOSIS — Z20822 Contact with and (suspected) exposure to covid-19: Secondary | ICD-10-CM | POA: Insufficient documentation

## 2020-02-14 DIAGNOSIS — Z01812 Encounter for preprocedural laboratory examination: Secondary | ICD-10-CM | POA: Insufficient documentation

## 2020-02-14 DIAGNOSIS — K5730 Diverticulosis of large intestine without perforation or abscess without bleeding: Secondary | ICD-10-CM | POA: Diagnosis not present

## 2020-02-14 DIAGNOSIS — Z79899 Other long term (current) drug therapy: Secondary | ICD-10-CM | POA: Diagnosis not present

## 2020-02-14 DIAGNOSIS — K640 First degree hemorrhoids: Secondary | ICD-10-CM | POA: Diagnosis not present

## 2020-02-15 ENCOUNTER — Encounter: Payer: Self-pay | Admitting: Internal Medicine

## 2020-02-15 LAB — SARS CORONAVIRUS 2 (TAT 6-24 HRS): SARS Coronavirus 2: NEGATIVE

## 2020-02-16 ENCOUNTER — Encounter: Payer: Self-pay | Admitting: Internal Medicine

## 2020-02-16 ENCOUNTER — Ambulatory Visit
Admission: RE | Admit: 2020-02-16 | Discharge: 2020-02-16 | Disposition: A | Discharge status: Home / Self Care | Payer: BC Managed Care – PPO | Attending: Internal Medicine | Admitting: Internal Medicine

## 2020-02-16 ENCOUNTER — Ambulatory Visit: Payer: BC Managed Care – PPO | Admitting: Certified Registered Nurse Anesthetist

## 2020-02-16 ENCOUNTER — Encounter
Admission: RE | Disposition: A | Discharge status: Home / Self Care | Payer: Self-pay | Source: Home / Self Care | Attending: Internal Medicine

## 2020-02-16 DIAGNOSIS — Z1211 Encounter for screening for malignant neoplasm of colon: Secondary | ICD-10-CM | POA: Diagnosis not present

## 2020-02-16 DIAGNOSIS — K5730 Diverticulosis of large intestine without perforation or abscess without bleeding: Secondary | ICD-10-CM | POA: Insufficient documentation

## 2020-02-16 DIAGNOSIS — K640 First degree hemorrhoids: Secondary | ICD-10-CM | POA: Insufficient documentation

## 2020-02-16 DIAGNOSIS — Z20822 Contact with and (suspected) exposure to covid-19: Secondary | ICD-10-CM | POA: Insufficient documentation

## 2020-02-16 DIAGNOSIS — Z79899 Other long term (current) drug therapy: Secondary | ICD-10-CM | POA: Insufficient documentation

## 2020-02-16 HISTORY — PX: COLONOSCOPY WITH PROPOFOL: SHX5780

## 2020-02-16 HISTORY — DX: Spina bifida occulta: Q76.0

## 2020-02-16 SURGERY — COLONOSCOPY WITH PROPOFOL
Anesthesia: General

## 2020-02-16 MED ORDER — PROPOFOL 10 MG/ML IV BOLUS
INTRAVENOUS | Status: DC | PRN
  Administered 2020-02-16: 30 mg via INTRAVENOUS
  Administered 2020-02-16: 80 mg via INTRAVENOUS

## 2020-02-16 MED ORDER — PROPOFOL 500 MG/50ML IV EMUL
INTRAVENOUS | Status: DC | PRN
  Administered 2020-02-16: 150 ug/kg/min via INTRAVENOUS

## 2020-02-16 MED ORDER — LIDOCAINE HCL (CARDIAC) PF 100 MG/5ML IV SOSY
PREFILLED_SYRINGE | INTRAVENOUS | Status: DC | PRN
  Administered 2020-02-16: 20 mg via INTRAVENOUS
  Administered 2020-02-16: 80 mg via INTRAVENOUS

## 2020-02-16 MED ORDER — SODIUM CHLORIDE 0.9 % IV SOLN
INTRAVENOUS | Status: DC
  Administered 2020-02-16: 13:00:00 1000 mL via INTRAVENOUS

## 2020-02-16 MED ORDER — LIDOCAINE HCL (PF) 2 % IJ SOLN
INTRAMUSCULAR | Status: AC
  Filled 2020-02-16: qty 5

## 2020-02-16 NOTE — Transfer of Care (Signed)
Immediate Anesthesia Transfer of Care Note  Patient: Derek Koch  Procedure(s) Performed: COLONOSCOPY WITH PROPOFOL (N/A )  Patient Location: PACU  Anesthesia Type:General  Level of Consciousness: awake, alert  and oriented  Airway & Oxygen Therapy: Patient Spontanous Breathing and Patient connected to nasal cannula oxygen  Post-op Assessment: Report given to RN and Post -op Vital signs reviewed and stable  Post vital signs: Reviewed and stable  Last Vitals:  Vitals Value Taken Time  BP 99/69 02/16/20 1425  Temp    Pulse 77 02/16/20 1425  Resp 13 02/16/20 1425  SpO2 98 % 02/16/20 1425  Vitals shown include unvalidated device data.  Last Pain:  Vitals:   02/16/20 1255  TempSrc: Temporal  PainSc: 0-No pain      Patients Stated Pain Goal: 0 (02/16/20 1255)  Complications: No complications documented.

## 2020-02-16 NOTE — Interval H&P Note (Signed)
History and Physical Interval Note:  02/16/2020 1:55 PM  Derek Koch  has presented today for surgery, with the diagnosis of Personal History Colon Polyps.  The various methods of treatment have been discussed with the patient and family. After consideration of risks, benefits and other options for treatment, the patient has consented to  Procedure(s) with comments: COLONOSCOPY WITH PROPOFOL (N/A) - NEEDS SPANISH INTERPRETER as a surgical intervention.  The patient's history has been reviewed, patient examined, no change in status, stable for surgery.  I have reviewed the patient's chart and labs.  Questions were answered to the patient's satisfaction.     [place], [place]

## 2020-02-16 NOTE — H&P (Signed)
Outpatient short stay form Pre-procedure 02/16/2020 1:54 PM Derek Koch K. Norma Fredrickson, M.D.  Primary Physician: Barbette Reichmann, M.D.  Reason for visit:  Colon cancer screening  History of present illness: Patient presents for colonoscopy for colon cancer screening. The patient denies complaints of abdominal pain, significant change in bowel habits, or rectal bleeding. Spanish interpreter was employed for history, physical and informed consent.     Current Facility-Administered Medications:  .  0.9 %  sodium chloride infusion, , Intravenous, Continuous, [place], Boykin Nearing, MD, Last Rate: 20 mL/hr at 02/16/20 1321, 1,000 mL at 02/16/20 1321  Medications Prior to Admission  Medication Sig Dispense Refill Last Dose  . APPLE CIDER VINEGAR PO Take by mouth.   02/16/2020 at Unknown time  . Desmopressin Acetate (NOCTIVA) 1.66 MCG/0.1ML EMUL Place 1 Act into the nose at bedtime. 3.8 g 11 Past Week at Unknown time  . Omega-3 Fatty Acids (FISH OIL PO) Take by mouth.   Past Week at Unknown time  . spironolactone (ALDACTONE) 50 MG tablet Take 50 mg by mouth daily.   02/16/2020 at Unknown time  . tamsulosin (FLOMAX) 0.4 MG CAPS capsule Take 0.4 mg by mouth 2 (two) times daily.    02/16/2020 at Unknown time  . tiZANidine (ZANAFLEX) 2 MG tablet Take by mouth at bedtime.   02/16/2020 at Unknown time  . amLODipine (NORVASC) 10 MG tablet Take by mouth.     . gabapentin (NEURONTIN) 600 MG tablet Take 600 mg by mouth 3 (three) times daily. (Patient not taking: Reported on 02/16/2020)   Not Taking at Unknown time  . hydrALAZINE (APRESOLINE) 25 MG tablet Take 25 mg by mouth 3 (three) times daily.     . Multiple Vitamins-Minerals (CENTRUM ADULTS PO) Take by mouth. (Patient not taking: Reported on 02/16/2020)   Completed Course at Unknown time  . nitrofurantoin (MACRODANTIN) 100 MG capsule Take 1 capsule (100 mg total) by mouth 2 (two) times daily. (Patient not taking: No sig reported) 14 capsule 7 Completed Course at  Unknown time     No Known Allergies   Past Medical History:  Diagnosis Date  . Depression   . Hypertension   . SBO (spina bifida occulta)   . Sleep apnea     Review of systems:  Otherwise negative.    Physical Exam  Gen: Alert, oriented. Appears stated age.  HEENT: [place]/AT. PERRLA. Lungs: CTA, no wheezes. CV: RR nl S1, S2. Abd: soft, benign, no masses. BS+ Ext: No edema. Pulses 2+    Planned procedures: Proceed with colonoscopy. The patient understands the nature of the planned procedure, indications, risks, alternatives and potential complications including but not limited to bleeding, infection, perforation, damage to internal organs and possible oversedation/side effects from anesthesia. The patient agrees and gives consent to proceed.  Please refer to procedure notes for findings, recommendations and patient disposition/instructions.     Virgil Lightner K. Norma Fredrickson, M.D. Gastroenterology 02/16/2020  1:54 PM

## 2020-02-16 NOTE — Anesthesia Preprocedure Evaluation (Signed)
Anesthesia Evaluation  Patient identified by MRN, date of birth, ID band Patient awake    Reviewed: Allergy & Precautions, NPO status , Patient's Chart, lab work & pertinent test results  Airway Mallampati: II  TM Distance: >3 FB     Dental   Pulmonary sleep apnea , former smoker,    Pulmonary exam normal        Cardiovascular hypertension, Normal cardiovascular exam     Neuro/Psych PSYCHIATRIC DISORDERS Depression  Neuromuscular disease    GI/Hepatic Neg liver ROS, Colon polyps   Endo/Other    Renal/GU negative Renal ROS  negative genitourinary   Musculoskeletal negative musculoskeletal ROS (+)   Abdominal Normal abdominal exam  (+)   Peds negative pediatric ROS (+)  Hematology negative hematology ROS (+)   Anesthesia Other Findings Past Medical History: No date: Depression No date: Hypertension No date: SBO (spina bifida occulta) No date: Sleep apnea  Adrenal polyp  Reproductive/Obstetrics                             Anesthesia Physical Anesthesia Plan  ASA: II  Anesthesia Plan: General   Post-op Pain Management:    Induction: Intravenous  PONV Risk Score and Plan:   Airway Management Planned: Nasal Cannula  Additional Equipment:   Intra-op Plan:   Post-operative Plan:   Informed Consent: I have reviewed the patients History and Physical, chart, labs and discussed the procedure including the risks, benefits and alternatives for the proposed anesthesia with the patient or authorized representative who has indicated his/her understanding and acceptance.     Dental advisory given  Plan Discussed with: CRNA and Surgeon  Anesthesia Plan Comments:         Anesthesia Quick Evaluation

## 2020-02-16 NOTE — Op Note (Addendum)
[facility] Gastroenterology Patient Name: Derek Koch Procedure Date: 02/16/2020 1:56 PM MRN: [id number] Account #: [id number] Date of Birth: Apr 09, 1965 Admit Type: Outpatient Age: 54 Room: [facility] ENDO ROOM 2 Gender: Male Note Status: Supervisor Override Procedure:             Colonoscopy Indications:           Adenomatous polyps in the colon Providers:             Boykin Nearing. Norma Fredrickson MD, MD Referring MD:          Barbette Reichmann, MD (Referring MD) Medicines:             Propofol per Anesthesia Complications:         No immediate complications. Procedure:             Pre-Anesthesia Assessment:                        - The risks and benefits of the procedure and the                         sedation options and risks were discussed with the                         patient. All questions were answered and informed                         consent was obtained.                        - Patient identification and proposed procedure were                         verified prior to the procedure by the nurse. The                         procedure was verified in the procedure room.                        - ASA Grade Assessment: III - A patient with severe                         systemic disease.                        - After reviewing the risks and benefits, the patient                         was deemed in satisfactory condition to undergo the                         procedure.                        - The risks and benefits of the procedure and the                         sedation options and risks were discussed with the  patient. All questions were answered and informed                         consent was obtained.                        - Patient identification and proposed procedure were                         verified prior to the procedure by the nurse. The                         procedure was verified in the procedure room.                         After obtaining informed consent, the colonoscope was                         passed under direct vision. Throughout the procedure,                         the patient's blood pressure, pulse, and oxygen                         saturations were monitored continuously. The                         Colonoscope was introduced through the anus and                         advanced to the the cecum, identified by appendiceal                         orifice and ileocecal valve. The colonoscopy was                         performed without difficulty. The patient tolerated                         the procedure well. The quality of the bowel                         preparation was adequate. The ileocecal valve,                         appendiceal orifice, and rectum were photographed. Findings:      The perianal and digital rectal examinations were normal. Pertinent       negatives include normal sphincter tone and no palpable rectal lesions.      Many small-mouthed diverticula were found in the sigmoid colon.      Non-bleeding internal hemorrhoids were found during retroflexion. The       hemorrhoids were Grade I (internal hemorrhoids that do not prolapse).      The exam was otherwise without abnormality. Impression:            - Diverticulosis in the sigmoid colon.                        - Non-bleeding internal hemorrhoids.                        -  The examination was otherwise normal.                        - No specimens collected. Recommendation:        - Patient has a contact number available for                         emergencies. The signs and symptoms of potential                         delayed complications were discussed with the patient.                         Return to normal activities tomorrow. Written                         discharge instructions were provided to the patient.                        - Resume previous diet.                        - Continue present  medications.                        - Repeat colonoscopy in 10 years for screening                         purposes.                        - Return to GI office after studies are complete. Procedure Code(s):     --- Professional ---                        [id number], Colorectal cancer screening; colonoscopy on                         individual not meeting criteria for high risk Diagnosis Code(s):     --- Professional ---                        K57.30, Diverticulosis of large intestine without                         perforation or abscess without bleeding                        K64.0, First degree hemorrhoids                        Z12.11, Encounter for screening for malignant neoplasm                         of colon CPT copyright 2019 American Medical Association. All rights reserved. The codes documented in this report are preliminary and upon coder review may  be revised to meet current compliance requirements. Efrain Sella MD, MD 02/16/2020 2:26:39 PM This report has been signed electronically. Number of Addenda: 0 Note Initiated On: 02/16/2020 1:56 PM Scope Withdrawal Time: 0 hours 5 minutes 49 seconds  Total Procedure Duration:  0 hours 8 minutes 20 seconds  Estimated Blood Loss:  Estimated blood loss: none. Estimated blood loss: none.      [facility]

## 2020-02-16 NOTE — Anesthesia Postprocedure Evaluation (Signed)
Anesthesia Post Note  Patient: Derek Koch  Procedure(s) Performed: COLONOSCOPY WITH PROPOFOL (N/A )  Patient location during evaluation: PACU Anesthesia Type: General Level of consciousness: awake and alert and oriented Pain management: pain level controlled Vital Signs Assessment: post-procedure vital signs reviewed and stable Respiratory status: spontaneous breathing Cardiovascular status: blood pressure returned to baseline Anesthetic complications: no   No complications documented.   Last Vitals:  Vitals:   02/16/20 1444 02/16/20 1454  BP: 120/84 125/74  Pulse: 73 68  Resp: 13 14  Temp:    SpO2: 99% 100%    Last Pain:  Vitals:   02/16/20 1454  TempSrc:   PainSc: 0-No pain                 Derek Koch

## 2020-02-17 ENCOUNTER — Encounter: Payer: Self-pay | Admitting: Internal Medicine

## 2021-12-24 ENCOUNTER — Ambulatory Visit: Payer: Self-pay

## 2021-12-24 ENCOUNTER — Ambulatory Visit (INDEPENDENT_AMBULATORY_CARE_PROVIDER_SITE_OTHER): Payer: Worker's Compensation | Admitting: Orthopedic Surgery

## 2021-12-24 VITALS — BP 131/77 | HR 89 | Ht 64.0 in | Wt 202.0 lb

## 2021-12-24 DIAGNOSIS — M545 Low back pain, unspecified: Secondary | ICD-10-CM

## 2021-12-24 NOTE — Progress Notes (Signed)
Orthopedic Spine Surgery Office Note  Assessment: Patient is a 56 y.o. male with radicular left leg pain after an injury on 10/03/2021. Pain has gotten significantly better with conservative treatment.    Plan: -Explained that initially conservative treatment is tried as a significant number of patients may experience relief with these treatment modalities. 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   ___________________________________________________________________________   History:  Patient is a 56 y.o. male who presents today for lumbar spine. Patient had an injury on 10/03/2021. He noted bilateral leg, electrical type pain after the injury. It radiated into both thighs. His right leg pain quickly resolved and now he is having left anterolateral thigh pain. Does not radiate past the knee. He notes it on a daily basis but it has been getting better with therapy. Pain has almost completely resolved at this point. 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  He has back pain that feels like soreness after a full day of landscaping, but no other back pain.   Weakness: denies Symptoms of imbalance: denies  Paresthesias and numbness: denies Bowel or bladder incontinence: denies Saddle anesthesia: denies  Treatments tried: physical therapy, activity modification  Review of systems: Denies fevers and  chills, night sweats, unexplained weight loss, history of cancer, pain that wakes them at night  Past medical history: Sleep apnea  Allergies: NKDA  Past surgical history:  None  Social history: Denies use of nicotine product (smoking, vaping, patches, smokeless) Alcohol use: denies Denies recreational drug use   Physical Exam:  General: no acute distress, appears stated age Neurologic: alert, answering questions appropriately, following commands Respiratory: unlabored breathing on room air, symmetric chest rise Psychiatric: appropriate affect, normal cadence to speech   MSK (spine):  -Strength exam      Left  Right EHL    5/5  5/5 TA    5/5  5/5 GSC    5/5  5/5 Knee extension  5/5  5/5 Hip flexion   5/5  5/5  -Sensory exam    Sensation intact to light touch in L3-S1 nerve distributions of bilateral lower extremities  -Achilles DTR: 2/4 on the left, 2/4 on the right -Patellar tendon DTR: 2/4 on the left, 2/4 on the right  -Straight leg raise: negative -Contralateral straight leg raise: negative -Clonus: no beats bilaterally  -Left hip exam: no pain through range of motion, negative stinchfield, negative FABER -Right hip exam: no pain through range of motion, negative stinchfield, negative FABER  Imaging: XR of the lumbar spine from 12/24/2021 was independently reviewed and interpreted, showing disc height loss at L5/S1. No fracture or dislocation. No evidence of instability on flexion/extension. Facet arthropathy in the lower lumbar spine.   MRI of the lumbar spine from outside institution was brought on a disc was independently reviewed and interpreted, showing small L5/S1 disc herniation not causing any nerve root compression. L5/S1 DDD and foraminal stenosis. Mild lateral recess stenosis at  several levels.    Patient name: Derek Koch Patient MRN: [id number] Date of visit: 12/24/21  This encounter was performed with an in-person spanish interpreter  throughout the entire encounter
